# Patient Record
Sex: Female | Born: 1955
Health system: Southern US, Community
[De-identification: ages and names within clinical notes are randomized; demographics above are authoritative.]

## PROBLEM LIST (undated history)

## (undated) DIAGNOSIS — I10 Essential (primary) hypertension: Secondary | ICD-10-CM

## (undated) DIAGNOSIS — O24419 Gestational diabetes mellitus in pregnancy, unspecified control: Secondary | ICD-10-CM

## (undated) HISTORY — DX: Essential (primary) hypertension: I10

## (undated) HISTORY — PX: CATARACT EXTRACTION, BILATERAL: SHX1313

## (undated) HISTORY — PX: OTHER SURGICAL HISTORY: SHX169

## (undated) HISTORY — DX: Gestational diabetes mellitus in pregnancy, unspecified control: O24.419

---

## 1988-02-22 HISTORY — PX: ABDOMINAL HYSTERECTOMY: SHX81

## 2017-11-20 ENCOUNTER — Encounter (INDEPENDENT_AMBULATORY_CARE_PROVIDER_SITE_OTHER): Payer: Self-pay

## 2017-11-20 ENCOUNTER — Other Ambulatory Visit: Payer: Self-pay

## 2017-11-20 ENCOUNTER — Ambulatory Visit
Admission: RE | Admit: 2017-11-20 | Discharge: 2017-11-20 | Disposition: A | Discharge status: Home / Self Care | Payer: Self-pay | Source: Ambulatory Visit | Attending: Oncology | Admitting: Oncology

## 2017-11-20 ENCOUNTER — Ambulatory Visit: Payer: Self-pay | Attending: Oncology

## 2017-11-20 VITALS — BP 143/87 | HR 83 | Temp 98.4°F | Ht 64.0 in | Wt 172.0 lb

## 2017-11-20 DIAGNOSIS — Z0000 Encounter for general adult medical examination without abnormal findings: Secondary | ICD-10-CM | POA: Insufficient documentation

## 2017-11-20 DIAGNOSIS — N63 Unspecified lump in unspecified breast: Secondary | ICD-10-CM

## 2017-11-20 NOTE — Progress Notes (Addendum)
  Subjective:     Patient ID: Lori Noble, female   DOB: 04/01/1955, 62 y.o.   MRN: [id number]  HPI   Review of Systems     Objective:   Physical Exam  Pulmonary/Chest: Right breast exhibits no inverted nipple, no mass, no nipple discharge, no skin change and no tenderness. Left breast exhibits no inverted nipple, no mass, no nipple discharge, no skin change and no tenderness. Breasts are symmetrical.       Assessment:     62 year old patient presents for [facility] clinic visit.  Patient screened, and meets BCCCP eligibility.  Patient does not have insurance, Medicare or Medicaid.  Handout given on Affordable Care Act.  Instructed patient on breast self awareness using teach back method.  Clinical breast exam unremarkable.  No mass or lump.  Patient has 3 adult children 2 in [place] , 1 in [place].     Plan:     Sent for bilateral screening mammogram.

## 2018-01-01 ENCOUNTER — Ambulatory Visit: Payer: Self-pay

## 2018-02-05 ENCOUNTER — Other Ambulatory Visit: Payer: Self-pay

## 2018-02-27 ENCOUNTER — Ambulatory Visit: Payer: Self-pay

## 2018-03-06 ENCOUNTER — Ambulatory Visit
Admission: RE | Admit: 2018-03-06 | Discharge: 2018-03-06 | Disposition: A | Discharge status: Home / Self Care | Payer: Self-pay | Source: Ambulatory Visit | Attending: Oncology | Admitting: Oncology

## 2018-03-06 DIAGNOSIS — N63 Unspecified lump in unspecified breast: Secondary | ICD-10-CM

## 2018-03-07 DIAGNOSIS — N63 Unspecified lump in unspecified breast: Secondary | ICD-10-CM

## 2018-03-07 NOTE — Progress Notes (Signed)
Radiologist discussed Birads 3 imaging findings with patient. Patient scheduled to return in 6 months for follow-up right breast mammogram, and ultrasound.  Mailed appointment info to patient.  Copy to HSIS.

## 2018-03-08 NOTE — Progress Notes (Signed)
Radiologist discussed Birads 3 results with patient and recommendation for right breast follow-up mammogram and ultrasound.  [facility].  Mailed appointment information .  Copy to HSIS.

## 2018-04-18 ENCOUNTER — Encounter

## 2018-04-18 ENCOUNTER — Encounter: Payer: Self-pay | Admitting: Family Medicine

## 2018-04-18 ENCOUNTER — Ambulatory Visit: Payer: Self-pay | Attending: Family Medicine | Admitting: Family Medicine

## 2018-04-18 VITALS — BP 150/104 | HR 80 | Temp 98.4°F | Resp 16 | Ht 65.0 in | Wt 173.0 lb

## 2018-04-18 DIAGNOSIS — Z8632 Personal history of gestational diabetes: Secondary | ICD-10-CM

## 2018-04-18 DIAGNOSIS — R030 Elevated blood-pressure reading, without diagnosis of hypertension: Secondary | ICD-10-CM

## 2018-04-18 DIAGNOSIS — E890 Postprocedural hypothyroidism: Secondary | ICD-10-CM

## 2018-04-18 MED ORDER — LEVOTHYROXINE SODIUM 100 MCG PO TABS: 100.0000 ug | ORAL_TABLET | Freq: Every day | ORAL | 1 refills | Status: DC

## 2018-04-18 MED FILL — LEVOTHYROXINE 100 MCG TAB: 100 | 30 days supply | Qty: 30 | Fill #0

## 2018-04-18 NOTE — Patient Instructions (Signed)
DASH Eating Plan  DASH stands for "Dietary Approaches to Stop Hypertension." The DASH eating plan is a healthy eating plan that has been shown to reduce high blood pressure (hypertension). It may also reduce your risk for type 2 diabetes, heart disease, and stroke. The DASH eating plan may also help with weight loss.  What are tips for following this plan?    General guidelines   Avoid eating more than 2,300 mg (milligrams) of salt (sodium) a day. If you have hypertension, you may need to reduce your sodium intake to 1,500 mg a day.   Limit alcohol intake to no more than 1 drink a day for nonpregnant women and 2 drinks a day for men. One drink equals 12 oz of beer, 5 oz of wine, or 1 oz of hard liquor.   Work with your health care provider to maintain a healthy body weight or to lose weight. Ask what an ideal weight is for you.   Get at least 30 minutes of exercise that causes your heart to beat faster (aerobic exercise) most days of the week. Activities may include walking, swimming, or biking.   Work with your health care provider or diet and nutrition specialist (dietitian) to adjust your eating plan to your individual calorie needs.  Reading food labels     Check food labels for the amount of sodium per serving. Choose foods with less than 5 percent of the Daily Value of sodium. Generally, foods with less than 300 mg of sodium per serving fit into this eating plan.   To find whole grains, look for the word "whole" as the first word in the ingredient list.  Shopping   Buy products labeled as "low-sodium" or "no salt added."   Buy fresh foods. Avoid canned foods and premade or frozen meals.  Cooking   Avoid adding salt when cooking. Use salt-free seasonings or herbs instead of table salt or sea salt. Check with your health care provider or pharmacist before using salt substitutes.   Do not fry foods. Cook foods using healthy methods such as baking, boiling, grilling, and broiling instead.   Cook with  heart-healthy oils, such as olive, canola, soybean, or sunflower oil.  Meal planning   Eat a balanced diet that includes:  ? 5 or more servings of fruits and vegetables each day. At each meal, try to fill half of your plate with fruits and vegetables.  ? Up to 6-8 servings of whole grains each day.  ? Less than 6 oz of lean meat, poultry, or fish each day. A 3-oz serving of meat is about the same size as a deck of cards. One egg equals 1 oz.  ? 2 servings of low-fat dairy each day.  ? A serving of nuts, seeds, or beans 5 times each week.  ? Heart-healthy fats. Healthy fats called Omega-3 fatty acids are found in foods such as flaxseeds and coldwater fish, like sardines, salmon, and mackerel.   Limit how much you eat of the following:  ? Canned or prepackaged foods.  ? Food that is high in trans fat, such as fried foods.  ? Food that is high in saturated fat, such as fatty meat.  ? Sweets, desserts, sugary drinks, and other foods with added sugar.  ? Full-fat dairy products.   Do not salt foods before eating.   Try to eat at least 2 vegetarian meals each week.   Eat more home-cooked food and less restaurant, buffet, and fast food.     When eating at a restaurant, ask that your food be prepared with less salt or no salt, if possible.  What foods are recommended?  The items listed may not be a complete list. Talk with your dietitian about what dietary choices are best for you.  Grains  Whole-grain or whole-wheat bread. Whole-grain or whole-wheat pasta. Brown rice. Oatmeal. Quinoa. Bulgur. Whole-grain and low-sodium cereals. Pita bread. Low-fat, low-sodium crackers. Whole-wheat flour tortillas.  Vegetables  Fresh or frozen vegetables (raw, steamed, roasted, or grilled). Low-sodium or reduced-sodium tomato and vegetable juice. Low-sodium or reduced-sodium tomato sauce and tomato paste. Low-sodium or reduced-sodium canned vegetables.  Fruits  All fresh, dried, or frozen fruit. Canned fruit in natural juice (without  added sugar).  Meat and other protein foods  Skinless chicken or turkey. Ground chicken or turkey. Pork with fat trimmed off. Fish and seafood. Egg whites. Dried beans, peas, or lentils. Unsalted nuts, nut butters, and seeds. Unsalted canned beans. Lean cuts of beef with fat trimmed off. Low-sodium, lean deli meat.  Dairy  Low-fat (1%) or fat-free (skim) milk. Fat-free, low-fat, or reduced-fat cheeses. Nonfat, low-sodium ricotta or cottage cheese. Low-fat or nonfat yogurt. Low-fat, low-sodium cheese.  Fats and oils  Soft margarine without trans fats. Vegetable oil. Low-fat, reduced-fat, or light mayonnaise and salad dressings (reduced-sodium). Canola, safflower, olive, soybean, and sunflower oils. Avocado.  Seasoning and other foods  Herbs. Spices. Seasoning mixes without salt. Unsalted popcorn and pretzels. Fat-free sweets.  What foods are not recommended?  The items listed may not be a complete list. Talk with your dietitian about what dietary choices are best for you.  Grains  Baked goods made with fat, such as croissants, muffins, or some breads. Dry pasta or rice meal packs.  Vegetables  Creamed or fried vegetables. Vegetables in a cheese sauce. Regular canned vegetables (not low-sodium or reduced-sodium). Regular canned tomato sauce and paste (not low-sodium or reduced-sodium). Regular tomato and vegetable juice (not low-sodium or reduced-sodium). Pickles. Olives.  Fruits  Canned fruit in a light or heavy syrup. Fried fruit. Fruit in cream or butter sauce.  Meat and other protein foods  Fatty cuts of meat. Ribs. Fried meat. Bacon. Sausage. Bologna and other processed lunch meats. Salami. Fatback. Hotdogs. Bratwurst. Salted nuts and seeds. Canned beans with added salt. Canned or smoked fish. Whole eggs or egg yolks. Chicken or turkey with skin.  Dairy  Whole or 2% milk, cream, and half-and-half. Whole or full-fat cream cheese. Whole-fat or sweetened yogurt. Full-fat cheese. Nondairy creamers. Whipped toppings.  Processed cheese and cheese spreads.  Fats and oils  Butter. Stick margarine. Lard. Shortening. Ghee. Bacon fat. Tropical oils, such as coconut, palm kernel, or palm oil.  Seasoning and other foods  Salted popcorn and pretzels. Onion salt, garlic salt, seasoned salt, table salt, and sea salt. Worcestershire sauce. Tartar sauce. Barbecue sauce. Teriyaki sauce. Soy sauce, including reduced-sodium. Steak sauce. Canned and packaged gravies. Fish sauce. Oyster sauce. Cocktail sauce. Horseradish that you find on the shelf. Ketchup. Mustard. Meat flavorings and tenderizers. Bouillon cubes. Hot sauce and Tabasco sauce. Premade or packaged marinades. Premade or packaged taco seasonings. Relishes. Regular salad dressings.  Where to find more information:   National Heart, Lung, and Blood Institute: www.nhlbi.nih.gov   American Heart Association: www.heart.org  Summary   The DASH eating plan is a healthy eating plan that has been shown to reduce high blood pressure (hypertension). It may also reduce your risk for type 2 diabetes, heart disease, and stroke.   With the   DASH eating plan, you should limit salt (sodium) intake to 2,300 mg a day. If you have hypertension, you may need to reduce your sodium intake to 1,500 mg a day.   When on the DASH eating plan, aim to eat more fresh fruits and vegetables, whole grains, lean proteins, low-fat dairy, and heart-healthy fats.   Work with your health care provider or diet and nutrition specialist (dietitian) to adjust your eating plan to your individual calorie needs.  This information is not intended to replace advice given to you by your health care provider. Make sure you discuss any questions you have with your health care provider.  Document Released: 01/27/2011 Document Revised: 02/01/2016 Document Reviewed: 02/01/2016  Elsevier Interactive Patient Education  2019 Elsevier Inc.

## 2018-04-18 NOTE — Progress Notes (Signed)
Establish care-  Medication refill on levothyroxine Allergies Left eye- told needs a LAG surgery -   Black seed oil Apple cider vinegar

## 2018-04-18 NOTE — Progress Notes (Signed)
Subjective:    Patient ID: Lori Noble, female    DOB: 1956/01/23, 63 y.o.   MRN: [id number]  HPI       63 yo female who is new to the practice. Patient has a history of hypothyroidism s/p radioactive iodine therapy. Patient reports high blood pressure during pregnancy but she did not require medication, and patient had gestational diabetes. Patient's only prescription medication is levothyroxine. Patient is a former smoker, started as a teenager, 1/2 ppd at the highest,  but stopped smoking about 7 years ago. She feels well overall. .Some stress as she is not currently working and not eligible for food stamps. Patient denies any excessive fatigue, no peripheral edema or constipation related to her thyroid disorder. She has had a recent mammogram. She does not believe that her colonoscopy is due. She declines influenza immunization.  Past Medical History:  Diagnosis Date  . Gestational diabetes   . Hypertension    with pregancy   Past Surgical History:  Procedure Laterality Date  . ABDOMINAL HYSTERECTOMY  1990  . CATARACT EXTRACTION, BILATERAL    . THYROIDECTOMY  2000   Family History  Problem Relation Age of Onset  . Diabetes Mother   . Diabetes Father   . Diabetes Sister   . Diabetes Brother   . Diabetes Sister   . Diabetes Brother    Social History   Tobacco Use  . Smoking status: Former Smoker    Start date: 04/19/2011  . Smokeless tobacco: Never Used  Substance Use Topics  . Alcohol use: Not Currently  . Drug use: Not on file   Allergies  Allergen Reactions  . Pollen Extract    Current Outpatient Medications:  .  Ascorbic Acid (VITAMIN C) 1000 MG tablet, Take 1,000 mg by mouth daily., Disp: , Rfl:  .  calcium-vitamin D (OSCAL WITH D) 500-200 MG-UNIT tablet, Take 1 tablet by mouth., Disp: , Rfl:  .  cholecalciferol (VITAMIN D3) 25 MCG (1000 UT) tablet, Take 1,000 Units by mouth daily., Disp: , Rfl:  .  levothyroxine (SYNTHROID, LEVOTHROID) 100 MCG tablet, Take  100 mcg by mouth daily before breakfast., Disp: , Rfl:  .  Specialty Vitamins Products (MAGNESIUM, AMINO ACID CHELATE,) 133 MG tablet, Take 1 tablet by mouth 2 (two) times daily., Disp: , Rfl:  .  zinc sulfate 220 (50 Zn) MG capsule, Take 5 mg by mouth daily., Disp: , Rfl:   Review of Systems  Constitutional: Negative for chills, fatigue and fever.  HENT: Negative for dental problem, hearing loss, sore throat and trouble swallowing.   Respiratory: Negative for cough and shortness of breath.   Cardiovascular: Negative for chest pain, palpitations and leg swelling.  Gastrointestinal: Negative for abdominal pain, blood in stool, constipation, diarrhea and nausea.  Endocrine: Negative for cold intolerance, heat intolerance, polydipsia, polyphagia and polyuria.  Genitourinary: Negative for dysuria, flank pain and frequency.  Musculoskeletal: Positive for back pain (occasional low back pain). Negative for gait problem.  Neurological: Negative for dizziness and headaches.  Hematological: Negative for adenopathy. Does not bruise/bleed easily.       Objective:   Physical Exam Vitals signs and nursing note reviewed.  Constitutional:      General: She is not in acute distress.    Appearance: Normal appearance.  HENT:     Head: Normocephalic and atraumatic.     Right Ear: Ear canal and external ear normal. There is impacted cerumen.     Left Ear: Tympanic membrane, ear canal and  external ear normal.     Nose: Nose normal. No rhinorrhea.     Mouth/Throat:     Mouth: Mucous membranes are moist.     Pharynx: Oropharynx is clear. No oropharyngeal exudate.  Neck:     Musculoskeletal: Normal range of motion and neck supple. No muscular tenderness.     Vascular: No carotid bruit.     Comments: Mild thyroid fullness and possible right sided nodule Cardiovascular:     Rate and Rhythm: Normal rate and regular rhythm.  Pulmonary:     Effort: Pulmonary effort is normal.     Breath sounds: Normal  breath sounds.  Abdominal:     General: Bowel sounds are normal.     Palpations: Abdomen is soft.     Tenderness: There is no abdominal tenderness. There is no right CVA tenderness, left CVA tenderness, guarding or rebound.  Musculoskeletal: Normal range of motion.        General: No tenderness.     Right lower leg: No edema.     Left lower leg: No edema.  Lymphadenopathy:     Cervical: No cervical adenopathy.  Skin:    General: Skin is warm and dry.  Neurological:     General: No focal deficit present.     Mental Status: She is alert and oriented to person, place, and time.     Cranial Nerves: No cranial nerve deficit.  Psychiatric:        Mood and Affect: Mood normal.        Behavior: Behavior normal.        Thought Content: Thought content normal.        Judgment: Judgment normal.    BP (!) 150/104   Pulse 80   Temp 98.4 F (36.9 C) (Oral)   Resp 16   Ht 5\' 5"  (1.651 m)   Wt 173 lb (78.5 kg)   SpO2 96%   BMI 28.79 kg/m         Assessment & Plan:  1. Postablative hypothyroidism Patient reports that she has had prior radioactive iodine ablation and is now on thyroid hormone replacement medication. Patient will have repeat TSH today and will be notified if dose change needed- patient requested 90 day refill of medication now as she may go to [place] at some point to take care of her 2 year old grandson. Discussed obtaining thyroid US but will give patient time to apply for financial assistance program. - levothyroxine (SYNTHROID, LEVOTHROID) 100 MCG tablet; Take 1 tablet (100 mcg total) by mouth daily before breakfast.  Dispense: 90 tablet; Refill: 1 - TSH  2. History of gestational diabetes BMP today as she is fasting and has a history of gestational diabetes. Patient with possible food insecurity and will need to continue a healthy diet to help prevent onset of diabetes and to help with lowering of blood pressure. Social work referral placed. - Basic Metabolic  Panel - Ambulatory referral to Social Work  3. Elevated blood pressure reading Blood pressure elevated today and she has had elevated blood pressure in the past during pregnancy. Information provided on DASH diet which can help lower blood pressure and info on DASH diet provided as part of her after visit summary. Discussed the need for repeat BP in a few weeks in order to determine if she has Hypertension. Social work consult regarding help obtaining healthy food. - Basic Metabolic Panel - Ambulatory referral to Social Work  An After Visit Summary was printed and given  to the patient.  Return for [place] for BP recheck in 2-4 weeks and 6 mos with PCP.

## 2018-04-19 LAB — BASIC METABOLIC PANEL WITH GFR
BUN/Creatinine Ratio: 9 — ABNORMAL LOW (ref 12–28)
BUN: 8 mg/dL (ref 8–27)
CO2: 22 mmol/L (ref 20–29)
Calcium: 9.7 mg/dL (ref 8.7–10.3)
Chloride: 104 mmol/L (ref 96–106)
Creatinine, Ser: 0.89 mg/dL (ref 0.57–1.00)
GFR calc Af Amer: 80 mL/min/1.73
GFR calc non Af Amer: 69 mL/min/1.73
Glucose: 147 mg/dL — ABNORMAL HIGH (ref 65–99)
Potassium: 4.4 mmol/L (ref 3.5–5.2)
Sodium: 143 mmol/L (ref 134–144)

## 2018-04-19 LAB — TSH: TSH: 0.801 u[IU]/mL (ref 0.450–4.500)

## 2018-04-24 ENCOUNTER — Telehealth: Payer: Self-pay | Admitting: *Deleted

## 2018-04-24 NOTE — Telephone Encounter (Signed)
Patient verified DOB Patient is aware of labs being normal and needing to be screened for DM at the next visit due to fasting glucose level in blood being 147.

## 2018-04-24 NOTE — Telephone Encounter (Signed)
-----   Message from Cain Saupe, MD sent at 04/22/2018  3:10 PM EST ----- Please notify patient that her TSH, thyroid hormone test was normal.  Patient's BMP showed a glucose of 147 which is considered elevated.  Glucose of 147 could potentially be close to normal if patient ate a large meal within 2 hours of her blood work.  Would recommend patient have hemoglobin A1c at her next visit or within the next few months if this has not been done previously

## 2018-05-07 ENCOUNTER — Ambulatory Visit: Payer: Self-pay

## 2018-05-07 ENCOUNTER — Ambulatory Visit: Payer: Self-pay | Attending: Family Medicine | Admitting: Pharmacist

## 2018-05-07 ENCOUNTER — Encounter: Payer: Self-pay | Admitting: Pharmacist

## 2018-05-07 ENCOUNTER — Other Ambulatory Visit: Payer: Self-pay | Admitting: Family Medicine

## 2018-05-07 ENCOUNTER — Other Ambulatory Visit: Payer: Self-pay

## 2018-05-07 VITALS — BP 166/85 | HR 91

## 2018-05-07 DIAGNOSIS — I10 Essential (primary) hypertension: Secondary | ICD-10-CM

## 2018-05-07 DIAGNOSIS — Z0130 Encounter for examination of blood pressure without abnormal findings: Secondary | ICD-10-CM

## 2018-05-07 MED ORDER — AMLODIPINE BESYLATE 5 MG PO TABS: 5.0000 mg | ORAL_TABLET | Freq: Every day | ORAL | 4 refills | Status: AC

## 2018-05-07 MED FILL — LEVOTHYROXINE 100 MCG TAB: 100 | 90 days supply | Qty: 90 | Fill #1

## 2018-05-07 NOTE — Progress Notes (Signed)
Patient ID: Lori Noble, female   DOB: 01/17/1956, 64 y.o.   MRN: [id number]   Patient was seen today by the clinical pharmacist in follow-up of elevated blood pressure.  Patient's blood pressure was still elevated when she was seen by the clinical pharmacist but patient was reluctant to start medication.  PATIENT however with hypertension as she has had elevated blood pressure readings on more than 2 occasions.  Prescription will be sent to the pharmacy for amlodipine 5 mg once daily for patient to start.  Patient will be contacted regarding the need to start medication for control of her blood pressure and patient will be asked to return to clinic in 4 weeks for office visit and follow-up of hypertension.

## 2018-05-07 NOTE — Progress Notes (Addendum)
   S:    Lori Noble arrives in good spirits.  Presents to the clinic for a BP check management. Lori Noble was referred by Dr. Jillyn Hidden on 04/18/18. BP at that visit 150/104. No medications were started.  Reports never being diagnosed with HTN. Denies chest pain, dyspnea, HA or blurred vision.   Lori Noble does not take medication for blood pressure.   Antihypertensives tried in the past include:  - Never taken  Dietary habits include: does not limit salt; consumes about 1-1.5 cups of espresso a day Exercise habits include: walks ~1 mile every other day Family / Social history:  - FHx: HTN (father), DM (two sisters, two brothers) - Former smoker (quit in 2013) - Drinks 2-3 beers/week  Home BP readings: not taking  O:  L arm after minutes rest: 166/85, HR 91  Last 3 Office BP readings: BP Readings from Last 3 Encounters:  04/18/18 (!) 150/104  11/20/17 (!) 143/87   BMET    Component Value Date/Time   NA 143 04/18/2018 1106   K 4.4 04/18/2018 1106   CL 104 04/18/2018 1106   CO2 22 04/18/2018 1106   GLUCOSE 147 (H) 04/18/2018 1106   BUN 8 04/18/2018 1106   CREATININE 0.89 04/18/2018 1106   CALCIUM 9.7 04/18/2018 1106   GFRNONAA 69 04/18/2018 1106   GFRAA 80 04/18/2018 1106   Renal function: CrCl cannot be calculated (Unknown ideal weight.).  Clinical ASCVD: No  The ASCVD Risk score Denman George DC Jr., et al., 2013) failed to calculate for the following reasons:   Cannot find a previous HDL lab   Cannot find a previous total cholesterol lab   Unable to determine if Lori Noble is Non-Hispanic African American  A/P: Hypertension undiagnosed but likely in the setting of multiple elevated BP readings in clinic. Will send to Lori Noble's PCP for review. Pt does not wish to add medications. I informed her that I would recommend an agent like amlodipine.   -Lipid: Lori Noble declined -Counseled on lifestyle modifications for blood pressure control including reduced dietary sodium, increased exercise,  adequate sleep -HM: tetanus and influenza vaccines due; deferred  Results reviewed and written information provided. Total time in face-to-face counseling 15 minutes.   F/U with PCP.    Lori Noble seen with:  Melvern Sample, PharmD Candidate  [employer]  Class of 2022  [place], PharmD, CPP Clinical Pharmacist [facility] [phone]   ADDENDUM: Lori Noble was made aware that she has hypertension back in March of this year.  Lori Noble has had elevated blood pressure on more than 2 occasions and prescription was also sent to pharmacy for Lori Noble to start amlodipine in addition to referral to clinical pharmacist.  Lori Noble will be contacted to make an office visit in follow-up of her hypertension in the next 3 to 4 weeks.  Cain Saupe MD

## 2018-05-07 NOTE — Patient Instructions (Signed)
Thank you for coming to see [place] today.   Blood pressure today is elevated.   I will reach out to your doctor concerning follow-up. +  Limiting salt and caffeine, as well as exercising as able for at least 30 minutes for 5 days out of the week, can also help you lower your blood pressure.  Take your blood pressure at home if you are able. Please write down these numbers and bring them to your visits.  If you have any questions about medications, please call me [phone].  Franky Macho

## 2018-07-25 ENCOUNTER — Telehealth: Payer: Self-pay | Admitting: *Deleted

## 2018-07-25 NOTE — Telephone Encounter (Signed)
-----   Message from Cain Saupe, MD sent at 07/25/2018 11:15 AM EDT ----- Regarding: needs follow-up appointment Please asked patient to schedule appointment in the next 3 to 4 weeks regarding her hypertension.  Please make patient aware that a prescription was previously sent into her pharmacy for her to start amlodipine 5 mg once daily to help control her blood pressure.

## 2018-07-25 NOTE — Telephone Encounter (Signed)
Called patient and LMOM to inform her with what provider stated.

## 2018-07-30 ENCOUNTER — Other Ambulatory Visit: Payer: Self-pay

## 2018-07-30 DIAGNOSIS — E890 Postprocedural hypothyroidism: Secondary | ICD-10-CM

## 2018-07-30 MED ORDER — LEVOTHYROXINE SODIUM 100 MCG PO TABS: 100.0000 ug | ORAL_TABLET | Freq: Every day | ORAL | 0 refills | Status: DC

## 2018-07-30 MED FILL — LEVOTHYROXINE 100 MCG TAB: 100 | 90 days supply | Qty: 90 | Fill #0

## 2018-09-05 ENCOUNTER — Inpatient Hospital Stay: Admission: RE | Admit: 2018-09-05 | Payer: Self-pay | Source: Ambulatory Visit

## 2018-09-05 ENCOUNTER — Other Ambulatory Visit: Payer: Self-pay

## 2018-12-05 ENCOUNTER — Encounter: Payer: Self-pay | Admitting: Family Medicine

## 2018-12-05 ENCOUNTER — Ambulatory Visit: Payer: Self-pay | Attending: Family Medicine | Admitting: Family Medicine

## 2018-12-05 ENCOUNTER — Other Ambulatory Visit: Payer: Self-pay

## 2018-12-05 DIAGNOSIS — E890 Postprocedural hypothyroidism: Secondary | ICD-10-CM

## 2018-12-05 DIAGNOSIS — Z8632 Personal history of gestational diabetes: Secondary | ICD-10-CM

## 2018-12-05 MED ORDER — LEVOTHYROXINE SODIUM 100 MCG PO TABS: 100.0000 ug | ORAL_TABLET | Freq: Every day | ORAL | 1 refills | Status: DC

## 2018-12-05 NOTE — Progress Notes (Signed)
Virtual Visit via Telephone Note  I connected with Lori Noble  on 12/05/18 at  2:50 PM EDT by telephone and verified that I am speaking with the correct person using two identifiers.   I discussed the limitations, risks, security and privacy concerns of performing an evaluation and management service by telephone and the availability of in person appointments. I also discussed with the patient that there may be a patient responsible charge related to this service. The patient expressed understanding and agreed to proceed.  Patient Location: Home Provider Location: [facility] Others participating in call: call initiated by [place], CMA who then transferred the call to me   History of Present Illness:        63 yo female with essential hypertension-controlled currently without medication- she is no longer on amlodipine and BP within normal when she checks it at the pharmacy,  Hypothyroidism for which she needs refill of levothyroxine - no signs/symptoms of overactive or underactive thyroid and history of gestational diabetes with elevated glucose on labs in February. She does urinate frequently but she also drinks a lot of water.  No increased thirst, no blurred vision, no numbness or tingling in the hands or feet.  She denies any issues with headaches or dizziness, she has had no unexplained weight loss or weight gain, no heat or cold intolerance, no chest pain or palpitations, no abdominal pain-no nausea/vomiting/diarrhea or constipation.  No peripheral edema.  The only positive on review of systems other than frequent urination daily increased water intake feels that patient states that she has hot flashes and she states that she believes that this may run in her family as her mother is 75 and still has hot flashes.  Patient reports that she would like to have enough refills of her thyroid medicine for 6 months.  She does not wish to have blood work at this time as she is currently  uninsured.  She does not believe that she has any current issues with her blood sugars.  Patient states that she will turn 76 in April and be eligible for Medicare and would like to wait until that time for any further blood work or test due to the cost. Believes that BP better after losing weight, changing diet and exercising.   Past Medical History:  Diagnosis Date  . Gestational diabetes   . Hypertension    with pregancy    Past Surgical History:  Procedure Laterality Date  . ABDOMINAL HYSTERECTOMY  1990   due to fibroids  . CATARACT EXTRACTION, BILATERAL    . right elbow surgery Right    due to "tennis elbow" after injuring her elbow    Family History  Problem Relation Age of Onset  . Hypertension Father   . Diabetes Sister   . Diabetes Brother   . Diabetes Sister   . Diabetes Brother     Social History   Tobacco Use  . Smoking status: Former Smoker    Start date: 04/19/2011  . Smokeless tobacco: Never Used  Substance Use Topics  . Alcohol use: Yes    Comment: Occa.  . Drug use: Never     Allergies  Allergen Reactions  . Pollen Extract        Observations/Objective: No vital signs or physical exam conducted as visit was done via telephone  Assessment and Plan: 1. Postablative hypothyroidism Patient's TSH in February was within normal.  Patient does not have any signs or symptoms of hypo-or hyperthyroidism.  Continue Synthroid 100 mcg refills provided. - levothyroxine (SYNTHROID) 100 MCG tablet; Take 1 tablet (100 mcg total) by mouth daily before breakfast.  Dispense: 90 tablet; Refill: 1  2. History of gestational diabetes; elevated glucose Patient with history of gestational diabetes and had elevated blood sugar on blood work done in February with glucose of 147.  She reports that she has made dietary changes and has lost weight.  She does not wish to come into the office at this time for hemoglobin A1c.  Patient reports that she will be eligible for Medicare in  April and will schedule follow-up at that time.  Follow Up Instructions: follow-up in April but sooner if signs/symptoms of diabetes as discussed or any concerns    I discussed the assessment and treatment plan with the patient. The patient was provided an opportunity to ask questions and all were answered. The patient agreed with the plan and demonstrated an understanding of the instructions.   The patient was advised to call back or seek an in-person evaluation if the symptoms worsen or if the condition fails to improve as anticipated.  I provided 11  minutes of non-face-to-face time during this encounter.   Antony Blackbird, MD

## 2019-02-20 ENCOUNTER — Telehealth: Payer: Self-pay | Admitting: Family Medicine

## 2019-02-20 NOTE — Telephone Encounter (Signed)
Pt call since the prescription the PCP sent levothyroxine (SYNTHROID) 100 MCG tablet [[id number]]  They don't carry that brand anymore and she is wonder if is another medication that will do the same to be sent to [facility] on [facility] please please follow up

## 2019-02-21 ENCOUNTER — Other Ambulatory Visit: Payer: Self-pay | Admitting: Family Medicine

## 2019-02-21 DIAGNOSIS — E890 Postprocedural hypothyroidism: Secondary | ICD-10-CM

## 2019-02-21 MED ORDER — LEVOTHYROXINE SODIUM 100 MCG PO TABS: 100.0000 ug | ORAL_TABLET | Freq: Every day | ORAL | 1 refills | Status: DC

## 2019-02-21 NOTE — Telephone Encounter (Signed)
I sent in refill RX to [facility] with note to pharmacy but I am not sure what they now carry so I will also need to call the pharmacy

## 2019-02-21 NOTE — Progress Notes (Signed)
Patient ID: Lori Noble, female   DOB: May 23, 1955, 63 y.o.   MRN: [id number]   Patient left phone message that levothyroxine is no longer carried by her pharmacy and she will need a substitute thyroid medication sent to her pharmacy.

## 2019-02-21 NOTE — Telephone Encounter (Signed)
Pt will need PCP approval to switch brands of levothyroxine with follow-up thyroid labs needed.

## 2019-05-16 ENCOUNTER — Encounter: Payer: Self-pay | Admitting: Family

## 2019-05-16 ENCOUNTER — Ambulatory Visit: Payer: Self-pay | Attending: Family | Admitting: Family

## 2019-05-16 ENCOUNTER — Other Ambulatory Visit: Payer: Self-pay

## 2019-05-16 VITALS — BP 150/85 | HR 78 | Temp 97.5°F | Resp 16 | Wt 140.0 lb

## 2019-05-16 DIAGNOSIS — E890 Postprocedural hypothyroidism: Secondary | ICD-10-CM

## 2019-05-16 MED ORDER — LEVOTHYROXINE SODIUM 100 MCG PO TABS: 100.0000 ug | ORAL_TABLET | Freq: Every day | ORAL | 1 refills | Status: DC

## 2019-05-16 NOTE — Patient Instructions (Addendum)
Continue levothyroxine as prescribed. Collect labs today. Follow-up in 6 months with primary physician or sooner if needed. Hypothyroidism  Hypothyroidism is when the thyroid gland does not make enough of certain hormones (it is underactive). The thyroid gland is a small gland located in the lower front part of the neck, just in front of the windpipe (trachea). This gland makes hormones that help control how the body uses food for energy (metabolism) as well as how the heart and brain function. These hormones also play a role in keeping your bones strong. When the thyroid is underactive, it produces too little of the hormones thyroxine (T4) and triiodothyronine (T3). What are the causes? This condition may be caused by:  Hashimoto's disease. This is a disease in which the body's disease-fighting system (immune system) attacks the thyroid gland. This is the most common cause.  Viral infections.  Pregnancy.  Certain medicines.  Birth defects.  Past radiation treatments to the head or neck for cancer.  Past treatment with radioactive iodine.  Past exposure to radiation in the environment.  Past surgical removal of part or all of the thyroid.  Problems with a gland in the center of the brain (pituitary gland).  Lack of enough iodine in the diet. What increases the risk? You are more likely to develop this condition if:  You are female.  You have a family history of thyroid conditions.  You use a medicine called lithium.  You take medicines that affect the immune system (immunosuppressants). What are the signs or symptoms? Symptoms of this condition include:  Feeling as though you have no energy (lethargy).  Not being able to tolerate cold.  Weight gain that is not explained by a change in diet or exercise habits.  Lack of appetite.  Dry skin.  Coarse hair.  Menstrual irregularity.  Slowing of thought processes.  Constipation.  Sadness or depression. How is this  diagnosed? This condition may be diagnosed based on:  Your symptoms, your medical history, and a physical exam.  Blood tests. You may also have imaging tests, such as an ultrasound or MRI. How is this treated? This condition is treated with medicine that replaces the thyroid hormones that your body does not make. After you begin treatment, it may take several weeks for symptoms to go away. Follow these instructions at home:  Take over-the-counter and prescription medicines only as told by your health care provider.  If you start taking any new medicines, tell your health care provider.  Keep all follow-up visits as told by your health care provider. This is important. ? As your condition improves, your dosage of thyroid hormone medicine may change. ? You will need to have blood tests regularly so that your health care provider can monitor your condition. Contact a health care provider if:  Your symptoms do not get better with treatment.  You are taking thyroid replacement medicine and you: ? Sweat a lot. ? Have tremors. ? Feel anxious. ? Lose weight rapidly. ? Cannot tolerate heat. ? Have emotional swings. ? Have diarrhea. ? Feel weak. Get help right away if you have:  Chest pain.  An irregular heartbeat.  A rapid heartbeat.  Difficulty breathing. Summary  Hypothyroidism is when the thyroid gland does not make enough of certain hormones (it is underactive).  When the thyroid is underactive, it produces too little of the hormones thyroxine (T4) and triiodothyronine (T3).  The most common cause is Hashimoto's disease, a disease in which the body's disease-fighting system (immune  system) attacks the thyroid gland. The condition can also be caused by viral infections, medicine, pregnancy, or past radiation treatment to the head or neck.  Symptoms may include weight gain, dry skin, constipation, feeling as though you do not have energy, and not being able to tolerate  cold.  This condition is treated with medicine to replace the thyroid hormones that your body does not make. This information is not intended to replace advice given to you by your health care provider. Make sure you discuss any questions you have with your health care provider. Document Revised: 01/20/2017 Document Reviewed: 01/18/2017 Elsevier Patient Education  2020 [employer].

## 2019-05-16 NOTE — Progress Notes (Addendum)
Patient ID: Lori Noble, female    DOB: [birth year]  MRN: [id number]  CC: Hypothyroidism follow-up  Subjective: Lori Noble is a 64 y.o. female with history of hypothyroidism, hypertension, and vitamin D deficiency who presents for hypothyroidism follow-up.    1. POSTABLATIVE HYPOTHYROIDISM FOLLOW-UP:  Patient presents for evaluation of thyroid function. Symptoms consist of weight gain, patient reports weight gain is normal for her in the winter. Symptoms have present for 1 year. The symptoms are denies symptoms.  The problem has been stable.  Previous thyroid studies include TSH. The hypothyroidism is due to postablative hypothyroidism. Comments: History of bilateral cataracts. Reports no recent change in vision but scheduled to get Yag capsulotomy sometime in May after her 64th birthday as she will then have Medicare. States that she takes medication daily as prescribed. Denies side effects from medication. Last visit February 2020 with Dr. Chapman Fitch during that encounter TSH was repeated, levothyroxine continued at current dose with notification if dose needs adjustment, and discussion of thyroid ultrasound pending approval for [facility] financial assistance/orange card. Patient reports was not approved for financial assistance and therefore did not receive thyroid ultrasound. In December 2020 when patient attempted to refill levothyroxine (Synthroid) at the pharmacy she was informed that the medication is no longer carried and that a substitute will need to sent. At that time an order was filled for levothyroxine (Euthyrox) 100 mcg daily with breakfast. Patient reports the medication is working well and would prefer to stay on the same medication moving forward. Requesting a 6 month refill of the medication.   Current Outpatient Medications on File Prior to Visit  Medication Sig Dispense Refill  . amLODipine (NORVASC) 5 MG tablet Take 1 tablet (5 mg total) by mouth daily. To lower blood  pressure (Patient not taking: Reported on 12/05/2018) 30 tablet 4  . Ascorbic Acid (VITAMIN C) 1000 MG tablet Take 1,000 mg by mouth daily.    . calcium-vitamin D (OSCAL WITH D) 500-200 MG-UNIT tablet Take 1 tablet by mouth.    . cholecalciferol (VITAMIN D3) 25 MCG (1000 UT) tablet Take 1,000 Units by mouth daily.    Marland Kitchen levothyroxine (SYNTHROID) 100 MCG tablet Take 1 tablet (100 mcg total) by mouth daily before breakfast. 30 tablet 1  . Specialty Vitamins Products (MAGNESIUM, AMINO ACID CHELATE,) 133 MG tablet Take 1 tablet by mouth 2 (two) times daily.    Marland Kitchen zinc sulfate 220 (50 Zn) MG capsule Take 5 mg by mouth daily.     No current facility-administered medications on file prior to visit.    Allergies  Allergen Reactions  . Pollen Extract     Social History   Socioeconomic History  . Marital status: Single    Spouse name: Not on file  . Number of children: Not on file  . Years of education: Not on file  . Highest education level: Not on file  Occupational History  . Not on file  Tobacco Use  . Smoking status: Former Smoker    Start date: 04/19/2011  . Smokeless tobacco: Never Used  Substance and Sexual Activity  . Alcohol use: Yes    Comment: Occa.  . Drug use: Never  . Sexual activity: Not on file  Other Topics Concern  . Not on file  Social History Narrative  . Not on file   Social Determinants of Health   Financial Resource Strain:   . Difficulty of Paying Living Expenses:   Food Insecurity:   . Worried About Running  Out of Food in the Last Year:   . Ran Out of Food in the Last Year:   Transportation Needs:   . Lack of Transportation (Medical):   Marland Kitchen Lack of Transportation (Non-Medical):   Physical Activity:   . Days of Exercise per Week:   . Minutes of Exercise per Session:   Stress:   . Feeling of Stress :   Social Connections:   . Frequency of Communication with Friends and Family:   . Frequency of Social Gatherings with Friends and Family:   . Attends  Religious Services:   . Active Member of Clubs or Organizations:   . Attends Banker Meetings:   Marland Kitchen Marital Status:   Intimate Partner Violence:   . Fear of Current or Ex-Partner:   . Emotionally Abused:   Marland Kitchen Physically Abused:   . Sexually Abused:     Family History  Problem Relation Age of Onset  . Hypertension Father   . Diabetes Sister   . Diabetes Brother   . Diabetes Sister   . Diabetes Brother     Past Surgical History:  Procedure Laterality Date  . ABDOMINAL HYSTERECTOMY  1990   due to fibroids  . CATARACT EXTRACTION, BILATERAL    . right elbow surgery Right    due to "tennis elbow" after injuring her elbow    ROS: Review of Systems Negative except as stated above  PHYSICAL EXAM: Vitals with BMI 05/16/2019 05/07/2018 04/18/2018  Height - - 5\' 5"   Weight 140 lbs - 173 lbs  BMI - - 28.79  Systolic 150 166  Diastolic 85 85 104  Pulse 78 91 80  SpO2- 95%, room air  Temperature- 97.5 F, oral   Physical Exam General appearance - alert, well appearing, and in no distress and oriented to person, place, and time Mental status - alert, oriented to person, place, and time, normal mood, behavior, speech, dress, motor activity, and thought processes Eyes - pupils equal and reactive, extraocular eye movements intact, funduscopic exam normal, discs flat and sharp Neck - supple, no significant adenopathy Lymphatics - no palpable lymphadenopathy, no hepatosplenomegaly Chest - clear to auscultation, no wheezes, rales or rhonchi, symmetric air entry, no tachypnea, retractions or cyanosis Heart - normal rate, regular rhythm, normal S1, S2, no murmurs, rubs, clicks or gallops Neurological - alert, oriented, normal speech, no focal findings or movement disorder noted, neck supple without rigidity, cranial nerves II through XII intact, funduscopic exam normal, discs flat and sharp, DTR's normal and symmetric, motor and sensory grossly normal bilaterally, normal muscle  tone, no tremors, strength 5/5, Romberg sign negative, normal gait and station  CMP Latest Ref Rng & Units 04/18/2018  Glucose 65 - 99 mg/dL 04/20/2018)  BUN 8 - 27 mg/dL 8  Creatinine 528(U - 1.32 mg/dL 4.40  Sodium 1.02 - 725 mmol/L 143  Potassium 3.5 - 5.2 mmol/L 4.4  Chloride 96 - 106 mmol/L 104  CO2 20 - 29 mmol/L 22  Calcium 8.7 - 10.3 mg/dL 9.7   Lipid Panel  No results found for: CHOL, TRIG, HDL, CHOLHDL, VLDL, LDLCALC, LDLDIRECT  CBC No results found for: WBC, RBC, HGB, HCT, PLT, MCV, MCH, MCHC, RDW, LYMPHSABS, MONOABS, EOSABS, BASOSABS  ASSESSMENT AND PLAN: 1. Postablative hypothyroidism: -Will send refill for 6 months. Continue at prescribed dose once today's labs result will update patient if medication adjustment required. -Follow-up in 6 months for labs and refills with primary physician or sooner if today's labs return abnormal.   -  Ultrasound of thyroid still pending related to patient financial concerns. May consider trying again in May to see if patient is eligible with Medicare. - TSH+T4F+T3Free - US THYROID; Future - levothyroxine (EUTHYROX) 100 MCG tablet; Take 1 tablet (100 mcg total) by mouth daily before breakfast.  Dispense: 90 tablet; Refill: 1  Patient was given the opportunity to ask questions.  Patient verbalized understanding of the plan and was able to repeat key elements of the plan. Patient was given clear instructions to go to Emergency Department or return to medical center if symptoms don't improve, worsen, or new problems develop.The patient verbalized understanding.  Requested Prescriptions    No prescriptions requested or ordered in this encounter    Livia Tarr Jodi Geralds, NP

## 2019-05-17 LAB — TSH+T4F+T3FREE
Free T4: 1.39 ng/dL (ref 0.82–1.77)
T3, Free: 2.6 pg/mL (ref 2.0–4.4)
TSH: 0.181 u[IU]/mL — ABNORMAL LOW (ref 0.450–4.500)

## 2019-05-17 NOTE — Progress Notes (Signed)
Please call patient with update.   Thyroid function is lower than normal.   Do not take thyroid medication for 1 week.   Then decrease dose to 75 mcg/daily with breakfast.  Make appointment to return in 4 to 6 for repeat thyroid lab.

## 2019-05-19 MED ORDER — LEVOTHYROXINE SODIUM 75 MCG PO TABS: 75.0000 ug | ORAL_TABLET | Freq: Every day | ORAL | 0 refills | Status: DC

## 2019-05-19 NOTE — Addendum Note (Signed)
Addended by: Rema Fendt on: 05/19/2019 11:38 AM   Modules accepted: Orders

## 2019-05-19 NOTE — Progress Notes (Signed)
Okay. I updated rxn just now and d/c previous rxn.

## 2019-05-21 ENCOUNTER — Telehealth: Payer: Self-pay

## 2019-05-21 NOTE — Telephone Encounter (Signed)
Contacted pt to go over lab results pt is aware of results   Pt states she doesn't have a thyroid. PT is requesting a call back from Amy. Pt states she has a lot of questions

## 2019-05-22 NOTE — Telephone Encounter (Signed)
I called to speak to patient regarding questions she has for provider. Name and date of birth used as patient identification. Patient states that she is confused about what a low TSH level means. Counseled patient that low TSH level means that she has hyperthyroidism because she is receiving too much Euthyrox and therefore medication dosage should be decreased. As a result of the patient's TSH being too low patient has was counseled to discontinue Euthyrox for 1 week and then resume taking the medication at a decreased rate of 75 mcg/daily by mouth with breakfast and return in 4 to 6 weeks for a repeat TSH lab. The new prescription has been ordered and sent to the patient's pharmacy on file. Patient initially being treated for postablative hypothyroidism with Euthyrox 100 mcg/daily by mouth with breakfast. Patient states that she understands and is agreeable to the plan of care.

## 2019-05-23 ENCOUNTER — Ambulatory Visit (HOSPITAL_COMMUNITY): Payer: Self-pay

## 2019-06-26 ENCOUNTER — Other Ambulatory Visit: Payer: Self-pay

## 2019-06-26 ENCOUNTER — Ambulatory Visit: Payer: Self-pay | Attending: Family Medicine

## 2019-06-26 DIAGNOSIS — E890 Postprocedural hypothyroidism: Secondary | ICD-10-CM

## 2019-06-27 LAB — TSH: TSH: 6.59 u[IU]/mL — ABNORMAL HIGH (ref 0.450–4.500)

## 2019-06-27 MED ORDER — LEVOTHYROXINE SODIUM 88 MCG PO TABS: 88.0000 ug | ORAL_TABLET | Freq: Every day | ORAL | 2 refills | Status: DC

## 2019-06-27 NOTE — Addendum Note (Signed)
Addended by: Rema Fendt on: 06/27/2019 04:36 PM   Modules accepted: Orders

## 2019-06-27 NOTE — Progress Notes (Signed)
Please call patient with update.   TSH higher than normal.   Increase Levothyroxine from 75 mcg/daily with breakfast to 88 mcg/daily with breakfast.   Keep appointment for thyroid ultrasound 07/08/2019 and physical exam appointment with primary physician 07/19/2019.  Medication sent to pharmacy on file.

## 2019-06-28 ENCOUNTER — Telehealth: Payer: Self-pay

## 2019-06-28 NOTE — Telephone Encounter (Signed)
Contacted pt to go over lab results pt is aware and doesn't have any questions or concerns 

## 2019-07-08 ENCOUNTER — Ambulatory Visit (HOSPITAL_COMMUNITY)
Admission: RE | Admit: 2019-07-08 | Discharge: 2019-07-08 | Disposition: A | Discharge status: Home / Self Care | Payer: Medicare Other | Source: Ambulatory Visit | Attending: Family | Admitting: Family

## 2019-07-08 ENCOUNTER — Other Ambulatory Visit: Payer: Self-pay

## 2019-07-08 DIAGNOSIS — E890 Postprocedural hypothyroidism: Secondary | ICD-10-CM | POA: Diagnosis not present

## 2019-07-09 NOTE — Progress Notes (Signed)
Call patient with update.   On ultrasound right thyroid gland appears decreased in size and without any nodules.   On ultrasound left thyroid gland appears significantly decreased in size and unidentifiable.   Continue thyroid medications as prescribed.   Follow-up with primary physician as scheduled.

## 2019-07-19 ENCOUNTER — Ambulatory Visit: Payer: Self-pay | Admitting: Family Medicine

## 2019-08-20 ENCOUNTER — Ambulatory Visit: Payer: Medicare Other | Attending: Family Medicine | Admitting: Nurse Practitioner

## 2019-08-20 ENCOUNTER — Encounter: Payer: Self-pay | Admitting: Nurse Practitioner

## 2019-08-20 ENCOUNTER — Other Ambulatory Visit: Payer: Self-pay

## 2019-08-20 VITALS — BP 134/94 | HR 84 | Temp 97.7°F | Wt 171.0 lb

## 2019-08-20 DIAGNOSIS — E559 Vitamin D deficiency, unspecified: Secondary | ICD-10-CM | POA: Insufficient documentation

## 2019-08-20 DIAGNOSIS — I10 Essential (primary) hypertension: Secondary | ICD-10-CM | POA: Insufficient documentation

## 2019-08-20 DIAGNOSIS — Z130 Encounter for screening for diseases of the blood and blood-forming organs and certain disorders involving the immune mechanism: Secondary | ICD-10-CM | POA: Diagnosis not present

## 2019-08-20 DIAGNOSIS — Z0000 Encounter for general adult medical examination without abnormal findings: Secondary | ICD-10-CM | POA: Diagnosis present

## 2019-08-20 DIAGNOSIS — Z13220 Encounter for screening for lipoid disorders: Secondary | ICD-10-CM | POA: Insufficient documentation

## 2019-08-20 DIAGNOSIS — Z79899 Other long term (current) drug therapy: Secondary | ICD-10-CM | POA: Insufficient documentation

## 2019-08-20 DIAGNOSIS — Z1211 Encounter for screening for malignant neoplasm of colon: Secondary | ICD-10-CM | POA: Diagnosis not present

## 2019-08-20 DIAGNOSIS — E039 Hypothyroidism, unspecified: Secondary | ICD-10-CM | POA: Insufficient documentation

## 2019-08-20 DIAGNOSIS — Z833 Family history of diabetes mellitus: Secondary | ICD-10-CM | POA: Diagnosis not present

## 2019-08-20 DIAGNOSIS — Z8249 Family history of ischemic heart disease and other diseases of the circulatory system: Secondary | ICD-10-CM | POA: Diagnosis not present

## 2019-08-20 DIAGNOSIS — Z79890 Hormone replacement therapy (postmenopausal): Secondary | ICD-10-CM | POA: Diagnosis not present

## 2019-08-20 NOTE — Patient Instructions (Signed)

## 2019-08-20 NOTE — Progress Notes (Signed)
Assessment & Plan:  Lori Noble was seen today for annual exam.  Diagnoses and all orders for this visit:  Encounter for annual physical exam  Vitamin D deficiency disease -     VITAMIN D 25 Hydroxy (Vit-D Deficiency, Fractures)  Essential hypertension -     CMP14+EGFR Continue all antihypertensives as prescribed.  Remember to bring in your blood pressure log with you for your follow up appointment.  DASH/Mediterranean Diets are healthier choices for HTN.    Screening for deficiency anemia  Lipid screening -     Lipid panel  Hypothyroidism, unspecified type -     CBC -     TSH  Colon cancer screening -     Fecal occult blood, imunochemical([facility])    Patient has been counseled on age-appropriate routine health concerns for screening and prevention. These are reviewed and up-to-date. Referrals have been placed accordingly. Immunizations are up-to-date or declined.    Subjective:   Chief Complaint  Patient presents with  . Annual Exam    Pt. is here for a physical.    HPI Lori Noble 64 y.o. female presents to office today for annual physical    Patient has been counseled on age-appropriate routine health concerns for screening and prevention. These are reviewed and up-to-date. Referrals have been placed accordingly. Immunizations are up-to-date or declined.     Declines mammogram today.   Essential Hypertension Blood pressure is elevated. Declines antihypertensive. States she does not like taking medications because they cause more side effects once you start taking them. Wants to work on weight and diet. I did instruct her that her blood pressure has been elevated for well over a year and she would benefit from taking her norvasc 5 mg daily. Denies chest pain, shortness of breath, palpitations, lightheadedness, dizziness, headaches or BLE edema.  BP Readings from Last 3 Encounters:  08/20/19 (!) 134/94  05/16/19 (!) 150/85  05/07/18 (!) 166/85         Review of Systems  Constitutional: Negative for fever, malaise/fatigue and weight loss.  HENT: Negative.  Negative for nosebleeds.   Eyes: Negative.  Negative for blurred vision, double vision and photophobia.  Respiratory: Negative.  Negative for cough and shortness of breath.   Cardiovascular: Negative.  Negative for chest pain, palpitations and leg swelling.  Gastrointestinal: Negative.  Negative for heartburn, nausea and vomiting.  Genitourinary: Negative.   Musculoskeletal: Negative.  Negative for myalgias.  Skin: Negative.   Neurological: Negative.  Negative for dizziness, focal weakness, seizures and headaches.  Endo/Heme/Allergies: Negative.   Psychiatric/Behavioral: Negative.  Negative for suicidal ideas.    Past Medical History:  Diagnosis Date  . Gestational diabetes   . Hypertension    with pregancy    Past Surgical History:  Procedure Laterality Date  . ABDOMINAL HYSTERECTOMY  1990   due to fibroids  . CATARACT EXTRACTION, BILATERAL    . right elbow surgery Right    due to "tennis elbow" after injuring her elbow    Family History  Problem Relation Age of Onset  . Hypertension Father   . Diabetes Sister   . Diabetes Brother   . Diabetes Sister   . Diabetes Brother     Social History Reviewed with no changes to be made today.   Outpatient Medications Prior to Visit  Medication Sig Dispense Refill  . levothyroxine (EUTHYROX) 88 MCG tablet Take 1 tablet (88 mcg total) by mouth daily before breakfast. 30 tablet 2  . amLODipine (NORVASC) 5 MG tablet  Take 1 tablet (5 mg total) by mouth daily. To lower blood pressure (Patient not taking: Reported on 12/05/2018) 30 tablet 4  . Ascorbic Acid (VITAMIN C) 1000 MG tablet Take 1,000 mg by mouth daily. (Patient not taking: Reported on 08/20/2019)    . calcium-vitamin D (OSCAL WITH D) 500-200 MG-UNIT tablet Take 1 tablet by mouth. (Patient not taking: Reported on 08/20/2019)    . cholecalciferol (VITAMIN D3) 25 MCG (1000  UT) tablet Take 1,000 Units by mouth daily. (Patient not taking: Reported on 08/20/2019)    . Specialty Vitamins Products (MAGNESIUM, AMINO ACID CHELATE,) 133 MG tablet Take 1 tablet by mouth 2 (two) times daily. (Patient not taking: Reported on 08/20/2019)    . zinc sulfate 220 (50 Zn) MG capsule Take 5 mg by mouth daily. (Patient not taking: Reported on 08/20/2019)     No facility-administered medications prior to visit.    Allergies  Allergen Reactions  . Pollen Extract        Objective:    BP (!) 134/94 (BP Location: Left Arm, Patient Position: Sitting, Cuff Size: Normal)   Pulse 84   Temp 97.7 F (36.5 C) (Temporal)   Wt 171 lb (77.6 kg)   SpO2 100%   BMI 28.46 kg/m  Wt Readings from Last 3 Encounters:  08/20/19 171 lb (77.6 kg)  05/16/19 140 lb (63.5 kg)  04/18/18 173 lb (78.5 kg)    Physical Exam Constitutional:      Appearance: She is well-developed.  HENT:     Head: Normocephalic and atraumatic.     Right Ear: External ear normal.     Left Ear: External ear normal.     Nose: Nose normal.     Mouth/Throat:     Pharynx: No oropharyngeal exudate.  Eyes:     General: No scleral icterus.       Right eye: No discharge.     Conjunctiva/sclera: Conjunctivae normal.     Pupils: Pupils are equal, round, and reactive to light.  Neck:     Thyroid: No thyromegaly.     Trachea: No tracheal deviation.  Cardiovascular:     Rate and Rhythm: Normal rate and regular rhythm.     Heart sounds: Normal heart sounds. No murmur heard.  No friction rub.  Pulmonary:     Effort: Pulmonary effort is normal. No accessory muscle usage or respiratory distress.     Breath sounds: Normal breath sounds. No decreased breath sounds, wheezing, rhonchi or rales.  Chest:     Chest wall: No mass or tenderness.     Breasts: Breasts are symmetrical.        Right: No inverted nipple, mass, nipple discharge, skin change or tenderness.        Left: No inverted nipple, mass, nipple discharge, skin  change or tenderness.  Abdominal:     General: Bowel sounds are normal. There is no distension.     Palpations: Abdomen is soft. There is no mass.     Tenderness: There is no abdominal tenderness. There is no guarding or rebound.  Musculoskeletal:        General: No tenderness or deformity. Normal range of motion.     Cervical back: Normal range of motion and neck supple.  Lymphadenopathy:     Cervical: No cervical adenopathy.  Skin:    General: Skin is warm and dry.     Findings: No erythema.  Neurological:     Mental Status: She is alert and oriented to person,  place, and time.     Cranial Nerves: No cranial nerve deficit.     Coordination: Coordination normal.     Deep Tendon Reflexes: Reflexes are normal and symmetric.  Psychiatric:        Speech: Speech normal.        Behavior: Behavior normal.        Thought Content: Thought content normal.        Judgment: Judgment normal.          Patient has been counseled extensively about nutrition and exercise as well as the importance of adherence with medications and regular follow-up. The patient was given clear instructions to go to ER or return to medical center if symptoms don't improve, worsen or new problems develop. The patient verbalized understanding.   Follow-up: Return in about 3 months (around 11/20/2019), or if symptoms worsen or fail to improve, for or sooner based on TSH level.   Gildardo Pounds, FNP-BC [facility] [place], [place]   08/21/2019, 5:19 PM

## 2019-08-21 ENCOUNTER — Encounter: Payer: Self-pay | Admitting: Nurse Practitioner

## 2019-08-21 LAB — CMP14+EGFR
ALT: 31 IU/L (ref 0–32)
AST: 23 IU/L (ref 0–40)
Albumin/Globulin Ratio: 1.4 (ref 1.2–2.2)
Albumin: 4.6 g/dL (ref 3.8–4.8)
Alkaline Phosphatase: 76 IU/L (ref 48–121)
BUN/Creatinine Ratio: 15 (ref 12–28)
BUN: 11 mg/dL (ref 8–27)
Bilirubin Total: 0.2 mg/dL (ref 0.0–1.2)
CO2: 25 mmol/L (ref 20–29)
Calcium: 9.9 mg/dL (ref 8.7–10.3)
Chloride: 100 mmol/L (ref 96–106)
Creatinine, Ser: 0.74 mg/dL (ref 0.57–1.00)
GFR calc Af Amer: 98 mL/min/{1.73_m2} (ref >59)
GFR calc non Af Amer: 85 mL/min/{1.73_m2} (ref >59)
Globulin, Total: 3.3 g/dL (ref 1.5–4.5)
Glucose: 79 mg/dL (ref 65–99)
Potassium: 4.9 mmol/L (ref 3.5–5.2)
Sodium: 142 mmol/L (ref 134–144)
Total Protein: 7.9 g/dL (ref 6.0–8.5)

## 2019-08-21 LAB — LIPID PANEL
Chol/HDL Ratio: 4 ratio (ref 0.0–4.4)
Cholesterol, Total: 226 mg/dL — ABNORMAL HIGH (ref 100–199)
HDL: 57 mg/dL (ref >39)
LDL Chol Calc (NIH): 154 mg/dL — ABNORMAL HIGH (ref 0–99)
Triglycerides: 85 mg/dL (ref 0–149)
VLDL Cholesterol Cal: 15 mg/dL (ref 5–40)

## 2019-08-21 LAB — CBC
Hematocrit: 42.4 % (ref 34.0–46.6)
Hemoglobin: 14.2 g/dL (ref 11.1–15.9)
MCH: 29.8 pg (ref 26.6–33.0)
MCHC: 33.5 g/dL (ref 31.5–35.7)
MCV: 89 fL (ref 79–97)
Platelets: 211 10*3/uL (ref 150–450)
RBC: 4.76 x10E6/uL (ref 3.77–5.28)
RDW: 13.1 % (ref 11.7–15.4)
WBC: 3.8 10*3/uL (ref 3.4–10.8)

## 2019-08-21 LAB — TSH: TSH: 0.443 u[IU]/mL — ABNORMAL LOW (ref 0.450–4.500)

## 2019-08-21 LAB — VITAMIN D 25 HYDROXY (VIT D DEFICIENCY, FRACTURES): Vit D, 25-Hydroxy: 21.7 ng/mL — ABNORMAL LOW (ref 30.0–100.0)

## 2019-08-21 MED ORDER — ATORVASTATIN CALCIUM 20 MG PO TABS
20.0000 mg | ORAL_TABLET | Freq: Every day | ORAL | 3 refills | Status: AC
Start: 2019-08-21 — End: ?

## 2019-08-22 MED FILL — ATORVASTATIN CALCIUM 20 MG: 20 | 30 days supply | Qty: 30 | Fill #0

## 2019-08-24 LAB — FECAL OCCULT BLOOD, IMMUNOCHEMICAL: Fecal Occult Bld: NEGATIVE

## 2019-09-16 ENCOUNTER — Other Ambulatory Visit: Payer: Self-pay | Admitting: Family Medicine

## 2019-09-16 DIAGNOSIS — E890 Postprocedural hypothyroidism: Secondary | ICD-10-CM

## 2019-09-16 MED ORDER — LEVOTHYROXINE SODIUM 88 MCG PO TABS: 88.0000 ug | ORAL_TABLET | Freq: Every day | ORAL | 0 refills | Status: DC

## 2019-09-16 MED FILL — LEVOTHYROXINE 88 MCG TABLET: 88 | 90 days supply | Qty: 90 | Fill #0

## 2019-09-16 NOTE — Telephone Encounter (Signed)
Medication: levothyroxine (EUTHYROX) 88 MCG tablet [[id number]]- requesting 90 day supply in the blister pack  Has the patient contacted their pharmacy? Yes  (Agent: If no, request that the patient contact the pharmacy for the refill.) (Agent: If yes, when and what did the pharmacy advise?)  Preferred Pharmacy (with phone number or street name): [facility] [place], [place]. [phone]  Agent: Please be advised that RX refills may take up to 3 business days. We ask that you follow-up with your pharmacy.

## 2019-09-23 MED ORDER — LEVOTHYROXINE SODIUM 88 MCG PO TABS: 88.0000 ug | ORAL_TABLET | Freq: Every day | ORAL | 0 refills | Status: DC

## 2019-09-23 NOTE — Telephone Encounter (Signed)
Patient states this was supposed to be sent to pharmacy below. Patient requesting a call as soon as it's sent.      [facility] [place], [place]. [phone]

## 2019-09-23 NOTE — Addendum Note (Signed)
Addended by: Lisabeth Pick on: 09/23/2019 12:24 PM   Modules accepted: Orders

## 2019-10-03 ENCOUNTER — Ambulatory Visit: Payer: Medicare Other | Admitting: Pharmacist

## 2019-10-03 ENCOUNTER — Ambulatory Visit: Payer: Medicare Other | Attending: Family Medicine

## 2019-10-03 ENCOUNTER — Other Ambulatory Visit: Payer: Self-pay

## 2019-10-03 DIAGNOSIS — E890 Postprocedural hypothyroidism: Secondary | ICD-10-CM

## 2019-10-03 DIAGNOSIS — E039 Hypothyroidism, unspecified: Secondary | ICD-10-CM

## 2019-10-04 ENCOUNTER — Other Ambulatory Visit: Payer: Self-pay | Admitting: Nurse Practitioner

## 2019-10-04 DIAGNOSIS — E890 Postprocedural hypothyroidism: Secondary | ICD-10-CM

## 2019-10-04 LAB — TSH: TSH: 0.19 u[IU]/mL — ABNORMAL LOW (ref 0.450–4.500)

## 2019-10-04 MED ORDER — LEVOTHYROXINE SODIUM 75 MCG PO TABS: 75.0000 ug | ORAL_TABLET | Freq: Every day | ORAL | 1 refills | Status: DC

## 2019-10-04 NOTE — Progress Notes (Signed)
Pt. Requested to resent her TSH medication to [facility]. RX sent.

## 2019-11-13 ENCOUNTER — Other Ambulatory Visit (HOSPITAL_COMMUNITY)
Admission: RE | Admit: 2019-11-13 | Discharge: 2019-11-13 | Disposition: A | Discharge status: Home / Self Care | Payer: Medicare Other | Source: Ambulatory Visit | Attending: Family Medicine | Admitting: Family Medicine

## 2019-11-13 ENCOUNTER — Encounter: Payer: Self-pay | Admitting: Family Medicine

## 2019-11-13 ENCOUNTER — Ambulatory Visit: Payer: Medicare Other | Admitting: Family Medicine

## 2019-11-13 ENCOUNTER — Other Ambulatory Visit: Payer: Self-pay

## 2019-11-13 ENCOUNTER — Ambulatory Visit: Payer: Medicare Other | Attending: Family Medicine | Admitting: Family Medicine

## 2019-11-13 VITALS — BP 149/81 | HR 69 | Ht 65.0 in | Wt 171.8 lb

## 2019-11-13 DIAGNOSIS — H527 Unspecified disorder of refraction: Secondary | ICD-10-CM

## 2019-11-13 DIAGNOSIS — E039 Hypothyroidism, unspecified: Secondary | ICD-10-CM | POA: Diagnosis not present

## 2019-11-13 DIAGNOSIS — R300 Dysuria: Secondary | ICD-10-CM

## 2019-11-13 DIAGNOSIS — R7303 Prediabetes: Secondary | ICD-10-CM | POA: Diagnosis not present

## 2019-11-13 LAB — POCT GLYCOSYLATED HEMOGLOBIN (HGB A1C): HbA1c, POC (controlled diabetic range): 6.2 % (ref 0.0–7.0)

## 2019-11-13 LAB — POCT URINALYSIS DIP (CLINITEK)
Bilirubin, UA: NEGATIVE
Glucose, UA: 100 mg/dL — AB
Ketones, POC UA: NEGATIVE mg/dL
Leukocytes, UA: NEGATIVE
Nitrite, UA: NEGATIVE
POC PROTEIN,UA: NEGATIVE
Spec Grav, UA: 1.015 (ref 1.010–1.025)
Urobilinogen, UA: 0.2 E.U./dL
pH, UA: 7 (ref 5.0–8.0)

## 2019-11-13 NOTE — Progress Notes (Signed)
Pain in lower back discomfort while urinating.  Referral to get eyes checked.  90 day supply for thyroid medication.

## 2019-11-13 NOTE — Progress Notes (Signed)
Subjective:  Patient ID: Lori Noble, female    DOB: 1955-12-05  Age: 64 y.o. MRN: [id number]  CC: Hypertension   HPI Lori Noble is a 64 year old female with a history of hypothyroidism who presents today with with acute complaints.  Complains of 6 weeks h/o intermittent dysuria but no lower back pain and used an OTC yeast medication for her symptoms with no relief.  Denies lower abdominal pain, flank pain or fever. She never filled Amlodipine even though it appears on her med list. States she walked here hence an elevated BP.  Also states that in the past she has had an elevated blood pressure after waiting for prolonged period of time in the exam room. Denies vaginal discharge.  Past Medical History:  Diagnosis Date  . Gestational diabetes   . Hypertension    with pregancy    Past Surgical History:  Procedure Laterality Date  . ABDOMINAL HYSTERECTOMY  1990   due to fibroids  . CATARACT EXTRACTION, BILATERAL    . right elbow surgery Right    due to "tennis elbow" after injuring her elbow    Family History  Problem Relation Age of Onset  . Hypertension Father   . Diabetes Sister   . Diabetes Brother   . Diabetes Sister   . Diabetes Brother     Allergies  Allergen Reactions  . Pollen Extract     Outpatient Medications Prior to Visit  Medication Sig Dispense Refill  . calcium-vitamin D (OSCAL WITH D) 500-200 MG-UNIT tablet Take 1 tablet by mouth.     . cholecalciferol (VITAMIN D3) 25 MCG (1000 UT) tablet Take 1,000 Units by mouth daily.     Marland Kitchen Specialty Vitamins Products (MAGNESIUM, AMINO ACID CHELATE,) 133 MG tablet Take 1 tablet by mouth 2 (two) times daily.     Marland Kitchen amLODipine (NORVASC) 5 MG tablet Take 1 tablet (5 mg total) by mouth daily. To lower blood pressure (Patient not taking: Reported on 12/05/2018) 30 tablet 4  . Ascorbic Acid (VITAMIN C) 1000 MG tablet Take 1,000 mg by mouth daily. (Patient not taking: Reported on 08/20/2019)    . atorvastatin  (LIPITOR) 20 MG tablet Take 1 tablet (20 mg total) by mouth daily. (Patient not taking: Reported on 11/13/2019) 90 tablet 3  . levothyroxine (EUTHYROX) 75 MCG tablet Take 1 tablet (75 mcg total) by mouth daily before breakfast. 30 tablet 1  . zinc sulfate 220 (50 Zn) MG capsule Take 5 mg by mouth daily. (Patient not taking: Reported on 08/20/2019)     No facility-administered medications prior to visit.     ROS Review of Systems  Constitutional: Negative for activity change, appetite change and fatigue.  HENT: Negative for congestion, sinus pressure and sore throat.   Eyes: Negative for visual disturbance.  Respiratory: Negative for cough, chest tightness, shortness of breath and wheezing.   Cardiovascular: Negative for chest pain and palpitations.  Gastrointestinal: Negative for abdominal distention, abdominal pain and constipation.  Endocrine: Negative for polydipsia.  Genitourinary: Negative for dysuria and frequency.  Musculoskeletal: Negative for arthralgias and back pain.  Skin: Negative for rash.  Neurological: Negative for tremors, light-headedness and numbness.  Hematological: Does not bruise/bleed easily.  Psychiatric/Behavioral: Negative for agitation and behavioral problems.    Objective:  BP (!) 149/81   Pulse 69   Ht 5\' 5"  (1.651 m)   Wt 171 lb 12.8 oz (77.9 kg)   SpO2 99%   BMI 28.59 kg/m   BP/Weight 11/13/2019 08/20/2019 05/16/2019  Systolic  BP 149 134 150  Diastolic BP 81 94 85  Wt. (Lbs) 171.8 171 140  BMI 28.59 28.46 23.3      Physical Exam Constitutional:      Appearance: She is well-developed.  Neck:     Vascular: No JVD.  Cardiovascular:     Rate and Rhythm: Normal rate.     Heart sounds: Normal heart sounds. No murmur heard.   Pulmonary:     Effort: Pulmonary effort is normal.     Breath sounds: Normal breath sounds. No wheezing or rales.  Chest:     Chest wall: No tenderness.  Abdominal:     General: Bowel sounds are normal. There is no  distension.     Palpations: Abdomen is soft. There is no mass.     Tenderness: There is no abdominal tenderness. There is no right CVA tenderness or left CVA tenderness.  Musculoskeletal:        General: Normal range of motion.     Right lower leg: No edema.     Left lower leg: No edema.  Neurological:     Mental Status: She is alert and oriented to person, place, and time.  Psychiatric:        Mood and Affect: Mood normal.     CMP Latest Ref Rng & Units 08/20/2019 04/18/2018  Glucose 65 - 99 mg/dL 79 884(Z)  BUN 8 - 27 mg/dL 11 8  Creatinine 6.60 - 1.00 mg/dL 6.30 1.60  Sodium 109 - 144 mmol/L 142 143  Potassium 3.5 - 5.2 mmol/L 4.9 4.4  Chloride 96 - 106 mmol/L 100 104  CO2 20 - 29 mmol/L 25 22  Calcium 8.7 - 10.3 mg/dL 9.9 9.7  Total Protein 6.0 - 8.5 g/dL 7.9 -  Total Bilirubin 0.0 - 1.2 mg/dL 0.2 -  Alkaline Phos 48 - 121 IU/L 76 -  AST 0 - 40 IU/L 23 -  ALT 0 - 32 IU/L 31 -    Lipid Panel     Component Value Date/Time   CHOL 226 (H) 08/20/2019 1602   TRIG 85 08/20/2019 1602   HDL 57 08/20/2019 1602   CHOLHDL 4.0 08/20/2019 1602   LDLCALC 154 (H) 08/20/2019 1602    CBC    Component Value Date/Time   WBC 3.8 08/20/2019 1602   RBC 4.76 08/20/2019 1602   HGB 14.2 08/20/2019 1602   HCT 42.4 08/20/2019 1602   PLT 211 08/20/2019 1602   MCV 89 08/20/2019 1602   MCH 29.8 08/20/2019 1602   MCHC 33.5 08/20/2019 1602   RDW 13.1 08/20/2019 1602    Lab Results  Component Value Date   HGBA1C 6.2 11/13/2019    Lab Results  Component Value Date   HGBA1C 6.2 11/13/2019    Assessment & Plan:  1. Hypothyroidism, unspecified type Last TSH was suppressed and her levothyroxine dose was adjusted We will send of labs again today and refill medication accordingly  2. Prediabetes New diagnosis with A1c of 6.2 Counseled on lifestyle modifications to prevent progression to diabetes mellitus - POCT glycosylated hemoglobin (Hb A1C)  3. Dysuria Negative for UTI,  presence of glucose in urine Screen for diabetes revealed presence of prediabetes Advised to increase fluid intake, cranberry juice - Cervicovaginal ancillary only - Urine Culture - POCT URINALYSIS DIP (CLINITEK)  4.  Refractive error Referred to ophthalmology per request  No orders of the defined types were placed in this encounter.   Follow-up: Return in about 3 months (around 02/12/2020) for  PCP - medical conditions.       Hoy Register, MD, FAAFP. [facility] [place], [place] [phone]   11/13/2019, 9:52 AM

## 2019-11-13 NOTE — Patient Instructions (Signed)

## 2019-11-14 ENCOUNTER — Other Ambulatory Visit: Payer: Self-pay | Admitting: Family Medicine

## 2019-11-14 ENCOUNTER — Telehealth: Payer: Self-pay

## 2019-11-14 DIAGNOSIS — E890 Postprocedural hypothyroidism: Secondary | ICD-10-CM

## 2019-11-14 LAB — CERVICOVAGINAL ANCILLARY ONLY
Bacterial Vaginitis (gardnerella): NEGATIVE
Candida Glabrata: NEGATIVE
Candida Vaginitis: NEGATIVE
Chlamydia: NEGATIVE
Comment: NEGATIVE
Comment: NEGATIVE
Comment: NEGATIVE
Comment: NEGATIVE
Comment: NEGATIVE
Comment: NORMAL
Neisseria Gonorrhea: NEGATIVE
Trichomonas: NEGATIVE

## 2019-11-14 LAB — TSH: TSH: 3.93 u[IU]/mL (ref 0.450–4.500)

## 2019-11-14 LAB — T4, FREE: Free T4: 1.21 ng/dL (ref 0.82–1.77)

## 2019-11-14 MED ORDER — LEVOTHYROXINE SODIUM 75 MCG PO TABS: 75.0000 ug | ORAL_TABLET | Freq: Every day | ORAL | 1 refills | Status: DC

## 2019-11-14 NOTE — Telephone Encounter (Signed)
-----   Message from Hoy Register, MD sent at 11/14/2019 12:48 PM EDT ----- Thyroid labs are normal

## 2019-11-14 NOTE — Telephone Encounter (Signed)
Patient name and DOB has been verified Patient was informed of lab results.   Patient is requesting 90 day supply of medication.

## 2019-11-15 LAB — URINE CULTURE

## 2019-11-15 MED ORDER — LEVOTHYROXINE SODIUM 75 MCG PO TABS: 75.0000 ug | ORAL_TABLET | Freq: Every day | ORAL | 0 refills | Status: DC

## 2019-11-15 NOTE — Telephone Encounter (Signed)
Patient was informed of medication being sent to pharmacy  °

## 2019-11-15 NOTE — Telephone Encounter (Signed)
Done

## 2019-11-18 ENCOUNTER — Encounter: Payer: Self-pay | Admitting: Family Medicine

## 2019-12-03 ENCOUNTER — Other Ambulatory Visit: Payer: Self-pay | Admitting: Family Medicine

## 2019-12-03 DIAGNOSIS — E890 Postprocedural hypothyroidism: Secondary | ICD-10-CM

## 2019-12-03 MED ORDER — LEVOTHYROXINE SODIUM 75 MCG PO TABS
75.0000 ug | ORAL_TABLET | Freq: Every day | ORAL | 0 refills | Status: AC
Start: 2019-12-03 — End: 2020-03-02

## 2019-12-03 NOTE — Telephone Encounter (Signed)
Requested Prescriptions  Pending Prescriptions Disp Refills  . levothyroxine (EUTHYROX) 75 MCG tablet 90 tablet 0    Sig: Take 1 tablet (75 mcg total) by mouth daily before breakfast.     Endocrinology:  Hypothyroid Agents Failed - 12/03/2019 12:48 PM      Failed - TSH needs to be rechecked within 3 months after an abnormal result. Refill until TSH is due.      Passed - TSH in normal range and within 360 days    TSH  Date Value Ref Range Status  11/13/2019 3.930 0.450 - 4.500 uIU/mL Final         Passed - Valid encounter within last 12 months    Recent Outpatient Visits          2 weeks ago Hypothyroidism, unspecified type   [facility] Hoy Register, MD   3 months ago Encounter for annual physical exam   [facility] [place], Shea Stakes, NP   6 months ago Postablative hypothyroidism   [facility] [place], [place], NP   12 months ago History of gestational diabetes   [facility] [place], [place], MD   1 year ago Postablative hypothyroidism   [facility] [place], [place], MD      Future Appointments            In 2 months [place], Hewitt Shorts, MD [facility]

## 2019-12-03 NOTE — Telephone Encounter (Signed)
Pt states her  levothyroxine (EUTHYROX) 75 MCG tablet  Was sent to the wrong pharmacy. she has left the pharmacy and would like you to resend it to the pharmacy she uses   [facility] [address] ([place]), [place] - [address] Phone:  [phone]  Fax:  [phone]

## 2020-02-12 ENCOUNTER — Ambulatory Visit: Payer: Medicare Other | Admitting: Family Medicine

## 2020-04-01 ENCOUNTER — Telehealth: Payer: Self-pay | Admitting: Family Medicine

## 2020-04-01 NOTE — Telephone Encounter (Signed)
Called to check the status of a request for the patient's last Fid test results.  Stated they sent a fax to the office to get these results and have not heard anything yet.  Please advise and call to give an update at [phone]

## 2020-04-02 NOTE — Telephone Encounter (Signed)
Fit test results has been faxed over.

## 2020-10-10 IMAGING — MG DIGITAL DIAGNOSTIC UNILATERAL RIGHT MAMMOGRAM
4 series · 4 of 12 positions shown · non-contrast
Comparison: Previous exam(s).

CLINICAL DATA: Possible right breast masses seen on most recent
screening mammography.

EXAM:
DIGITAL DIAGNOSTIC RIGHT MAMMOGRAM WITH CAD
ULTRASOUND RIGHT BREAST

[R MLO synth-2D]
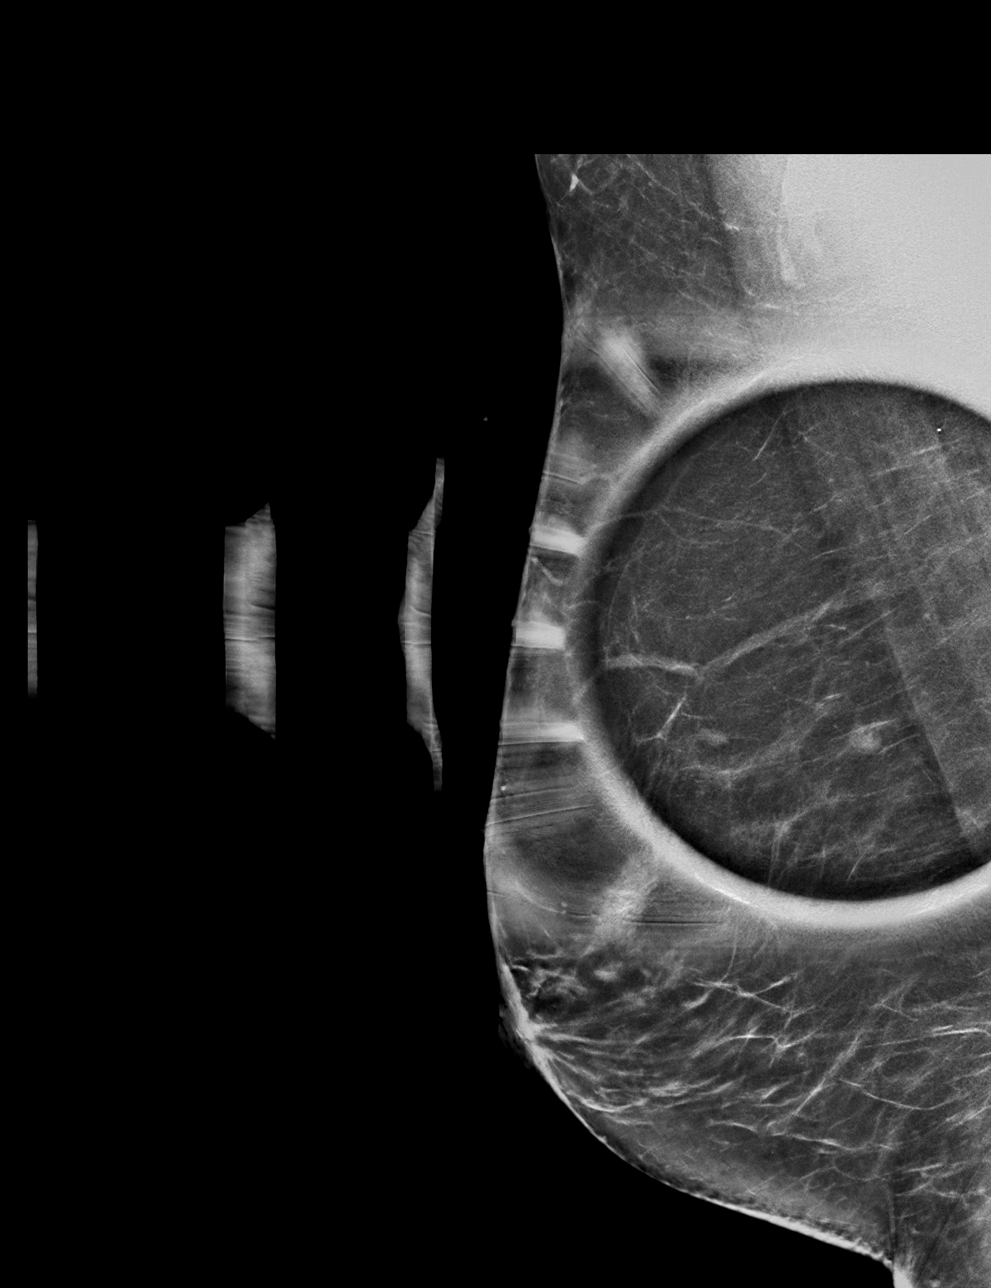

[R CC synth-2D]
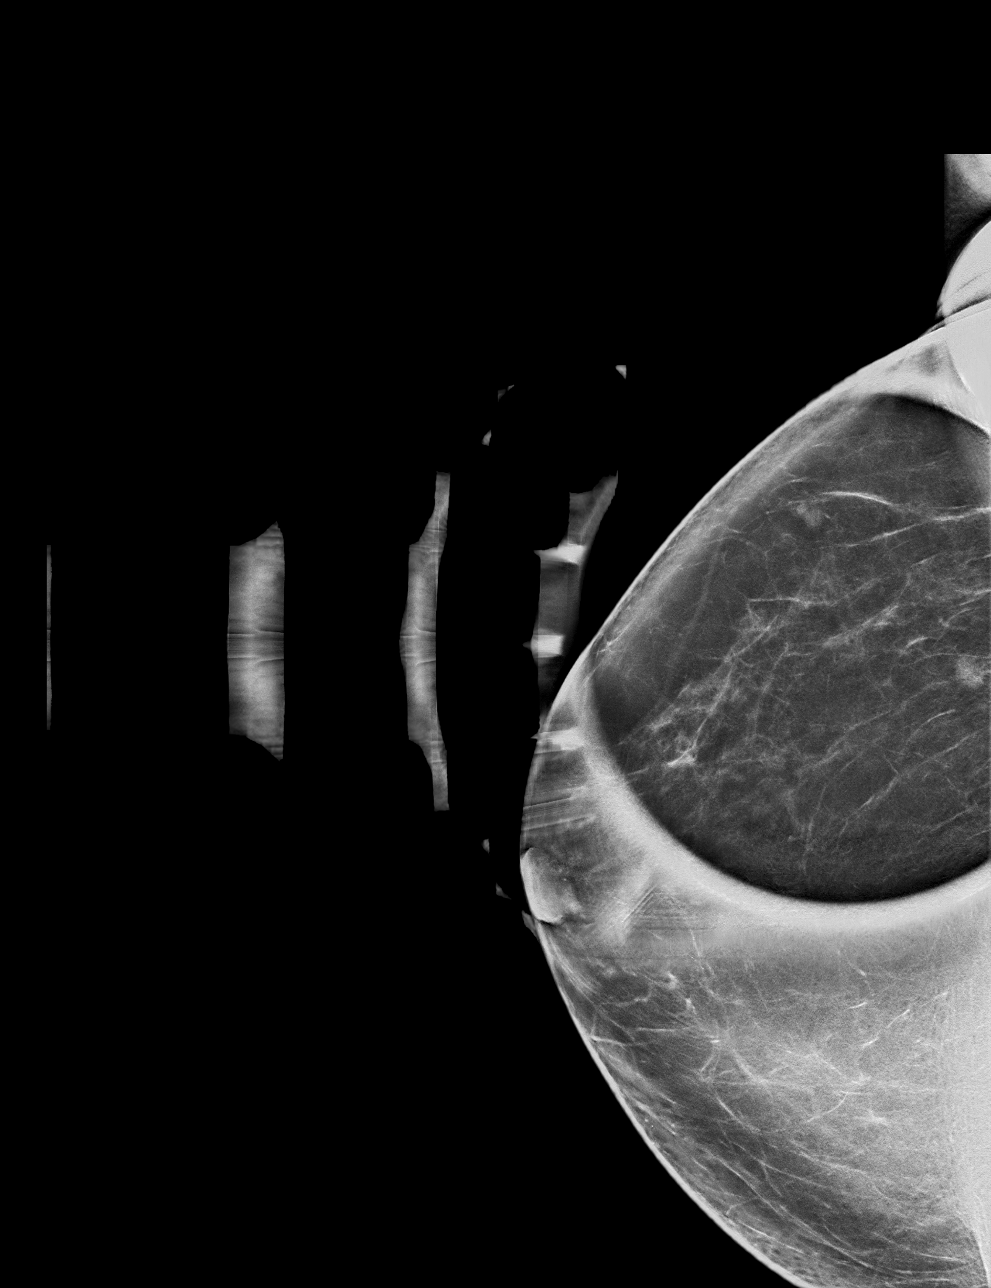

[R CC tomo · tomo slice 34/67.0]
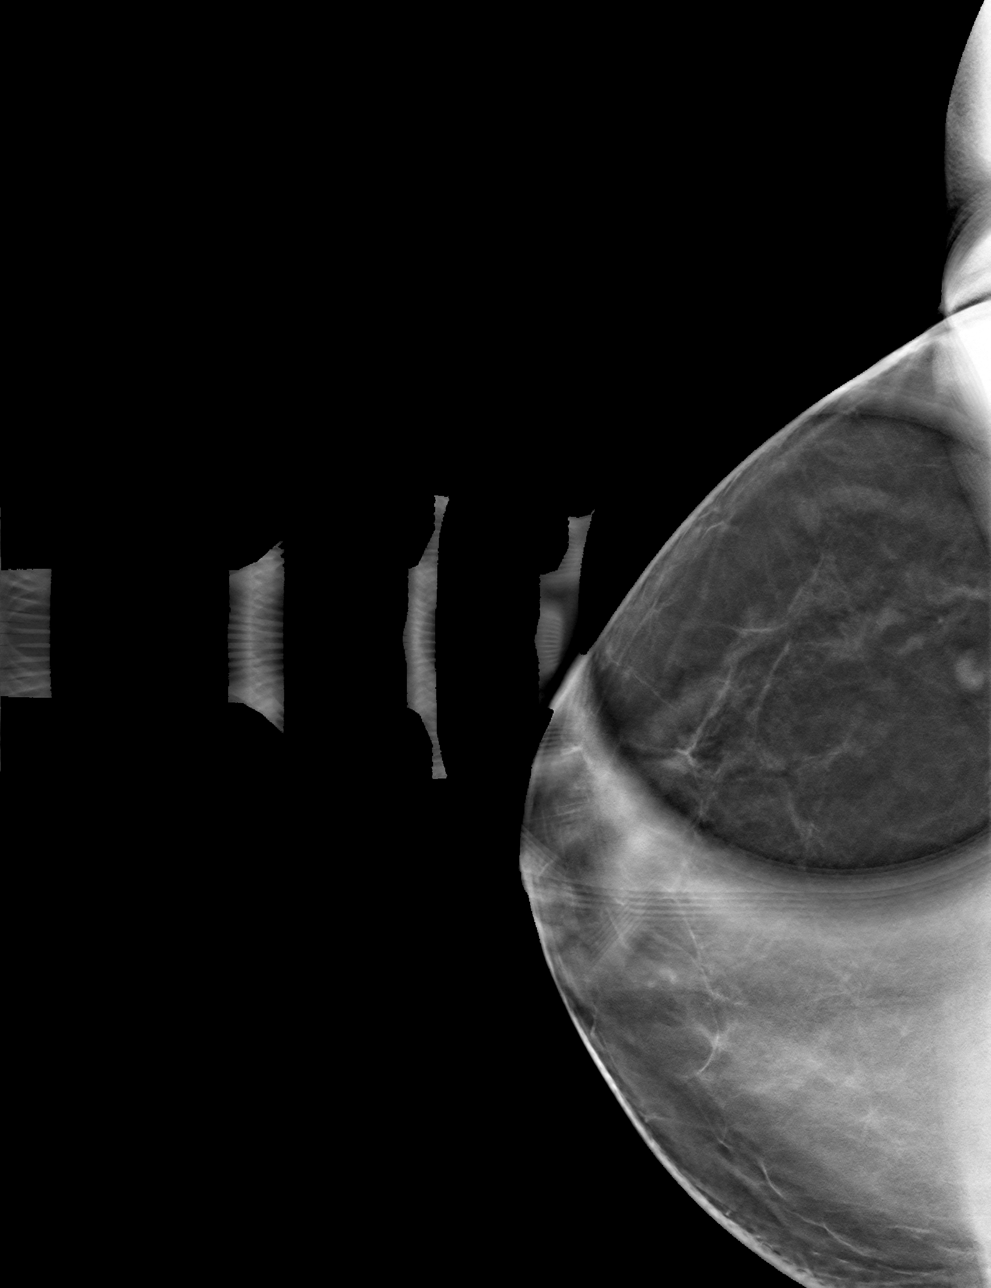

[R MLO tomo · tomo slice 38/75.0]
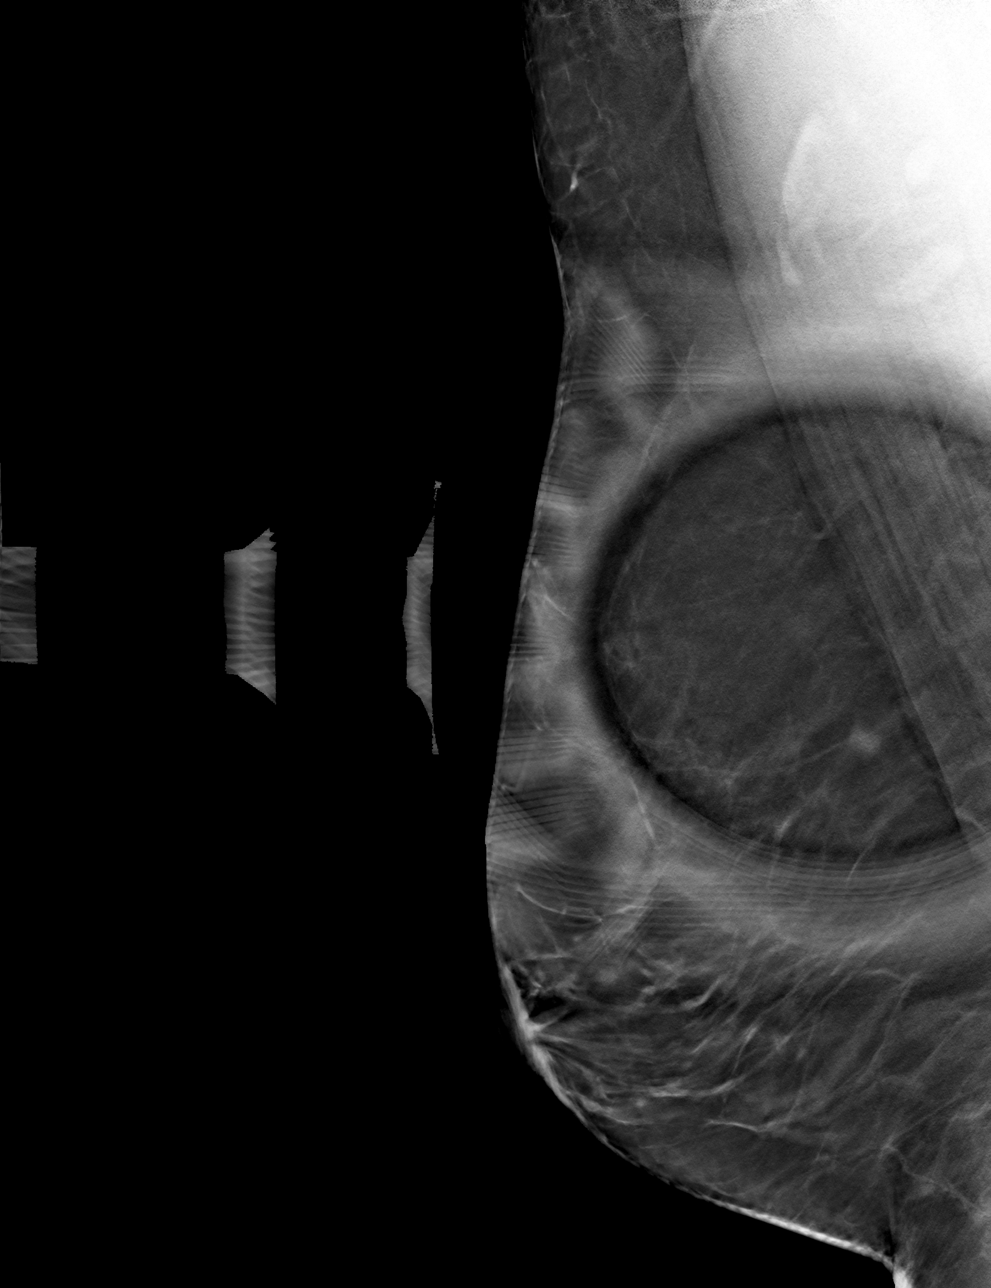

[4 of 12 positions shown; findings below may reference images not displayed]

ACR Breast Density Category b: There are scattered areas of
fibroglandular density.
FINDINGS: Additional mammographic views of the right breast demonstrate
persistent circumscribed fat containing nodule in the right breast
upper outer quadrant, posterior depth, measuring 5 mm consistent
with an intramammary lymph node. There are 2 adjacent few mm nodules
measuring collectively 4 mm in the right breast upper outer
quadrant, middle depth. These may also represent 2 adjacent tiny
intramammary lymph nodes, however are not as typical in appearance.

Mammographic images were processed with CAD.

On physical exam, no suspicious masses are palpated.

Targeted ultrasound is performed, showing right breast 9 o'clock 8
cm from the nipple benign-appearing intramammary lymph node
measuring 6 mm in long-axis, which corresponds to the more posterior
nodule seen mammographically. Additionally, there are 2 adjacent
circumscribed hypoechoic nodules in the right breast 10 o'clock 3 cm
from the nipple measuring collectively 0.4 x 0.4 x 0.3 cm. There is
a suggestion presence of hila.
IMPRESSION: Right breast 9 o'clock benign-appearing intramammary lymph node.

Right breast 10 o'clock 3 cm from the nipple 2 adjacent probable
intramammary lymph nodes, for which short-term follow-up is
recommended.

RECOMMENDATION:
Diagnostic mammogram and possibly ultrasound of the right breast in
6 months. (Code:3G-9-R3G)

I have discussed the findings and recommendations with the patient.
Results were also provided in writing at the conclusion of the
visit. If applicable, a reminder letter will be sent to the patient
regarding the next appointment.

BI-RADS CATEGORY  3: Probably benign.

## 2021-08-10 ENCOUNTER — Telehealth: Payer: Self-pay

## 2021-08-10 NOTE — Telephone Encounter (Signed)
LVM for Patient to return call  RE: Schedule AWV with [facility] with Feliz Herard Mathews CMA  Please transfer patient when if they return the call  

## 2022-08-29 ENCOUNTER — Other Ambulatory Visit (HOSPITAL_BASED_OUTPATIENT_CLINIC_OR_DEPARTMENT_OTHER): Payer: Self-pay | Admitting: Nurse Practitioner

## 2022-08-29 DIAGNOSIS — Z0000 Encounter for general adult medical examination without abnormal findings: Secondary | ICD-10-CM
